# Patient Record
Sex: Male | Born: 1980 | Race: Black or African American | Hispanic: No | Marital: Single | Smoking: Current every day smoker
Health system: Southern US, Community
[De-identification: ages and names within clinical notes are randomized; demographics above are authoritative.]

## PROBLEM LIST (undated history)

## (undated) HISTORY — PX: WISDOM TOOTH EXTRACTION: SHX21

---

## 2020-06-03 ENCOUNTER — Encounter (HOSPITAL_BASED_OUTPATIENT_CLINIC_OR_DEPARTMENT_OTHER): Payer: Self-pay | Admitting: Emergency Medicine

## 2020-06-03 ENCOUNTER — Emergency Department (HOSPITAL_BASED_OUTPATIENT_CLINIC_OR_DEPARTMENT_OTHER)
Admission: EM | Admit: 2020-06-03 | Discharge: 2020-06-03 | Disposition: A | Discharge status: Home / Self Care | Payer: Medicaid Other | Attending: Emergency Medicine | Admitting: Emergency Medicine

## 2020-06-03 ENCOUNTER — Other Ambulatory Visit: Payer: Self-pay

## 2020-06-03 DIAGNOSIS — K047 Periapical abscess without sinus: Secondary | ICD-10-CM | POA: Diagnosis not present

## 2020-06-03 DIAGNOSIS — R220 Localized swelling, mass and lump, head: Secondary | ICD-10-CM | POA: Diagnosis present

## 2020-06-03 MED ORDER — LIDOCAINE HCL (PF) 1 % IJ SOLN
30.0000 mL | Freq: Once | INTRAMUSCULAR | Status: DC
  Filled 2020-06-03: qty 30

## 2020-06-03 MED ORDER — HYDROCODONE-ACETAMINOPHEN 5-325 MG PO TABS
1.0000 | ORAL_TABLET | Freq: Four times a day (QID) | ORAL | 0 refills | Status: DC | PRN
Start: 2020-06-03 — End: 2023-10-04

## 2020-06-03 MED ORDER — HYDROCODONE-ACETAMINOPHEN 5-325 MG PO TABS
2.0000 | ORAL_TABLET | Freq: Once | ORAL | Status: AC
  Administered 2020-06-03: 2 via ORAL
  Filled 2020-06-03: qty 2

## 2020-06-03 MED ORDER — CLINDAMYCIN HCL 300 MG PO CAPS: 300.0000 mg | ORAL_CAPSULE | Freq: Three times a day (TID) | ORAL | 0 refills | Status: DC

## 2020-06-03 MED ORDER — CLINDAMYCIN HCL 150 MG PO CAPS
300.0000 mg | ORAL_CAPSULE | Freq: Once | ORAL | Status: AC
  Administered 2020-06-03: 300 mg via ORAL
  Filled 2020-06-03: qty 2

## 2020-06-03 NOTE — ED Triage Notes (Signed)
Patient presents with complaints of facial swelling onset 1 week ago; states pain when opening mouth; denies any recent trauma; denies recent dental work; denies dental pain.

## 2020-06-03 NOTE — ED Provider Notes (Signed)
[facility] EMERGENCY DEPARTMENT Provider Note   CSN: [id number] Arrival date & time: 06/03/20  0149     History Chief Complaint  Patient presents with  . Facial Swelling    Darrell Price is a 39 y.o. male.  The history is provided by the patient.  Dental Pain Location:  Upper Quality:  Aching Severity:  Moderate Onset quality:  Sudden Timing:  Constant Progression:  Unchanged Chronicity:  New Relieved by:  Nothing Worsened by:  Jaw movement Associated symptoms: facial swelling   Associated symptoms: no fever         PMH-none Social History   Tobacco Use  . Smoking status: Never Smoker  . Smokeless tobacco: Never Used  Substance Use Topics  . Alcohol use: Not Currently  . Drug use: Not Currently    Home Medications Prior to Admission medications   Medication Sig Start Date End Date Taking? Authorizing Provider  clindamycin (CLEOCIN) 300 MG capsule Take 1 capsule (300 mg total) by mouth 3 (three) times daily. X 7 days 06/03/20   Zadie Rhine, MD  HYDROcodone-acetaminophen (NORCO/VICODIN) 5-325 MG tablet Take 1 tablet by mouth every 6 (six) hours as needed for severe pain. 06/03/20   Zadie Rhine, MD    Allergies    Amoxicillin and Peanut-containing drug products  Review of Systems   Review of Systems  Constitutional: Negative for fever.  HENT: Positive for dental problem and facial swelling.   Eyes: Negative for visual disturbance.  Gastrointestinal: Negative for vomiting.    Physical Exam Updated Vital Signs BP 124/78 (BP Location: Right Arm)   Pulse 78   Temp 98.1 F (36.7 C) (Oral)   Resp 18   Ht 1.676 m (5\' 6" )   Wt 81.6 kg   SpO2 97%   BMI 29.05 kg/m   Physical Exam CONSTITUTIONAL: Well developed/well nourished HEAD: Normocephalic/atraumatic EYES: EOMI/PERRL ENMT: Mucous membranes moist, poor dentition.  Decayed tooth noted to left molar region.  Gingival abscess noted to the area.  Mild facial swelling is noted,  no external abscess.  No facial crepitus no trismus.  No drooling or stridor NECK: supple no meningeal signs CV: S1/S2 noted, no murmurs/rubs/gallops noted LUNGS: Lungs are clear to auscultation bilaterally, no apparent distress ABDOMEN: soft  NEURO: Pt is awake/alert/appropriate, moves all extremitiesx4.  No facial droop.   EXTREMITIES:  full ROM SKIN: warm, color normal PSYCH: no abnormalities of mood noted, alert and oriented to situation  ED Results / Procedures / Treatments   Labs (all labs ordered are listed, but only abnormal results are displayed) Labs Reviewed - No data to display  EKG None  Radiology No results found.  Procedures . Incision and Drainage  Date/Time: 06/03/2020 3:30 AM Performed by: 06/05/2020, MD Authorized by: Zadie Rhine, MD   Consent:    Consent obtained:  Verbal   Consent given by:  Patient   Risks discussed:  Bleeding and incomplete drainage   Alternatives discussed:  No treatment Location:    Indications for incision and drainage: Dental abscess.   Location:  Mouth   Mouth location: Gingival. Anesthesia (see MAR for exact dosages):    Anesthesia method:  Local infiltration and topical application   Topical anesthetic:  Benzocaine gel Procedure type:    Complexity:  Simple Procedure details:    Needle aspiration: no     Incision types:  Stab incision   Scalpel blade:  11   Drainage:  Bloody and purulent   Drainage amount:  Moderate   Wound  treatment:  Wound left open    Medications Ordered in ED Medications  lidocaine (PF) (XYLOCAINE) 1 % injection 30 mL ( Infiltration Canceled Entry 06/03/20 0317)  clindamycin (CLEOCIN) capsule 300 mg (300 mg Oral Given 06/03/20 0234)  HYDROcodone-acetaminophen (NORCO/VICODIN) 5-325 MG per tablet 2 tablet (2 tablets Oral Given 06/03/20 0235)    ED Course  I have reviewed the triage vital signs and the nursing notes.  MDM Rules/Calculators/A&P                          Patient  with dental abscess.  After placing benzocaine, I made a stab incision to the gingival abscess, patient had a moderate amount of blood and pus extracted.  Overall he feels improved.  Bleeding has been controlled.  Patient will be discharged home Final Clinical Impression(s) / ED Diagnoses Final diagnoses:  Dental abscess    Rx / DC Orders ED Discharge Orders         Ordered    clindamycin (CLEOCIN) 300 MG capsule  3 times daily        06/03/20 0221    HYDROcodone-acetaminophen (NORCO/VICODIN) 5-325 MG tablet  Every 6 hours PRN        06/03/20 0249           Zadie Rhine, MD 06/03/20 0345

## 2021-11-18 ENCOUNTER — Emergency Department (HOSPITAL_BASED_OUTPATIENT_CLINIC_OR_DEPARTMENT_OTHER)
Admission: EM | Admit: 2021-11-18 | Discharge: 2021-11-18 | Disposition: A | Discharge status: Home / Self Care | Payer: Medicaid Other | Attending: Emergency Medicine | Admitting: Emergency Medicine

## 2021-11-18 ENCOUNTER — Emergency Department (HOSPITAL_BASED_OUTPATIENT_CLINIC_OR_DEPARTMENT_OTHER): Payer: Medicaid Other

## 2021-11-18 ENCOUNTER — Encounter (HOSPITAL_BASED_OUTPATIENT_CLINIC_OR_DEPARTMENT_OTHER): Payer: Self-pay | Admitting: Emergency Medicine

## 2021-11-18 ENCOUNTER — Other Ambulatory Visit: Payer: Self-pay

## 2021-11-18 DIAGNOSIS — S6991XA Unspecified injury of right wrist, hand and finger(s), initial encounter: Secondary | ICD-10-CM | POA: Diagnosis present

## 2021-11-18 DIAGNOSIS — S62339A Displaced fracture of neck of unspecified metacarpal bone, initial encounter for closed fracture: Secondary | ICD-10-CM

## 2021-11-18 DIAGNOSIS — S62316A Displaced fracture of base of fifth metacarpal bone, right hand, initial encounter for closed fracture: Secondary | ICD-10-CM | POA: Diagnosis not present

## 2021-11-18 DIAGNOSIS — M79641 Pain in right hand: Secondary | ICD-10-CM | POA: Diagnosis not present
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
## 2021-11-18 NOTE — Discharge Instructions (Addendum)
You were diagnosed today with a fracture of the right hand at the fifth metacarpal.  This was placed in a splint.  You should keep the splint on at all times.  Recommend Tylenol and Advil for pain and inflammation control.  You may also use ice as tolerated.  Please call the provided hand surgery office first thing Monday morning to schedule an appointment for follow-up. ?

## 2022-06-13 ENCOUNTER — Other Ambulatory Visit: Payer: Self-pay | Admitting: Family Medicine

## 2022-06-13 DIAGNOSIS — S161XXA Strain of muscle, fascia and tendon at neck level, initial encounter: Secondary | ICD-10-CM

## 2022-06-20 ENCOUNTER — Ambulatory Visit
Admission: RE | Admit: 2022-06-20 | Discharge: 2022-06-20 | Disposition: A | Discharge status: Home / Self Care | Payer: Worker's Compensation | Source: Ambulatory Visit | Attending: Family Medicine | Admitting: Family Medicine

## 2022-06-20 DIAGNOSIS — S161XXA Strain of muscle, fascia and tendon at neck level, initial encounter: Secondary | ICD-10-CM

## 2022-10-26 LAB — AMB RESULTS CONSOLE CBG: Glucose: 90

## 2022-11-10 ENCOUNTER — Encounter: Payer: Self-pay | Admitting: *Deleted

## 2022-11-10 NOTE — Progress Notes (Signed)
Pt attended 10/26/22 event where b/p was 153/91. At the event, the pt stated he did not have a PCP and did not identify any SDOH insecurities. During follow up phone call, pt confirmed he does sometimes use the health dept for healthcare but had not had PCP. Mailing address updated from event and pt asked that PCP options be mailed to him so [facility] flyer and [facility] info mailed to him. Pt states he also has MY Chart so he confirmed he would check letter on My Chart also, as he has misplaced his event paper with his exact b/p for f/u with PCP in future.

## 2023-01-04 ENCOUNTER — Encounter: Payer: Self-pay | Admitting: *Deleted

## 2023-01-04 NOTE — Progress Notes (Signed)
Pt attended 10/26/22 screening event where his b/p was 153/91 (on recheck) and his blood sugar was 90. At the event, the pt did not identify any SDOH insecurities and documented he did not have a PCP. During 1st event follow-up, pt asked for PCP info to be mailed to him at a temporary address in [place], so letter sent with [facility] and [facility] flyers. Today, during the 2nd follow up call, pt states he did receiver the flyers and has chosen the PCP he wants to see from the info mailed to him but has not yet made an appt to get established. He noted his current address on file in [place] is still correct and pt verbalized understanding of the benefits of getting established with PCP for ongoing healthcare access and to address any health issues. Pt did state he has had his b/p rechecked and "it was better - must just have been something I did that day." Pt did not have any additional healthcare access support needs at this time.

## 2023-05-06 ENCOUNTER — Other Ambulatory Visit: Payer: Self-pay

## 2023-05-06 ENCOUNTER — Encounter (HOSPITAL_BASED_OUTPATIENT_CLINIC_OR_DEPARTMENT_OTHER): Payer: Self-pay

## 2023-05-06 ENCOUNTER — Emergency Department (HOSPITAL_BASED_OUTPATIENT_CLINIC_OR_DEPARTMENT_OTHER)
Admission: EM | Admit: 2023-05-06 | Discharge: 2023-05-06 | Disposition: A | Discharge status: Home / Self Care | Payer: 59 | Attending: Emergency Medicine | Admitting: Emergency Medicine

## 2023-05-06 DIAGNOSIS — Z0000 Encounter for general adult medical examination without abnormal findings: Secondary | ICD-10-CM

## 2023-05-06 DIAGNOSIS — Z91010 Allergy to peanuts: Secondary | ICD-10-CM | POA: Diagnosis not present

## 2023-05-06 DIAGNOSIS — R7309 Other abnormal glucose: Secondary | ICD-10-CM | POA: Diagnosis not present

## 2023-05-06 DIAGNOSIS — Z113 Encounter for screening for infections with a predominantly sexual mode of transmission: Secondary | ICD-10-CM | POA: Insufficient documentation

## 2023-05-06 DIAGNOSIS — Z0001 Encounter for general adult medical examination with abnormal findings: Secondary | ICD-10-CM | POA: Insufficient documentation

## 2023-05-06 LAB — HIV ANTIBODY (ROUTINE TESTING W REFLEX): HIV Screen 4th Generation wRfx: NONREACTIVE

## 2023-05-06 LAB — CBG MONITORING, ED: Glucose-Capillary: 101 mg/dL — ABNORMAL HIGH (ref 70–99)

## 2023-05-06 NOTE — ED Triage Notes (Signed)
Pt reports he would like to get screened for STD's. Pt would like a wellness check and check his BP and blood glucose, it runs in his family and he thinks he might be diabetic. No urinary sx, no pain.

## 2023-05-06 NOTE — ED Provider Notes (Signed)
[place] [facility]ERGENCY DEPARTMENT AT [facility] Provider Note   CSN: [id number] Arrival date & time: 05/06/23  0109     History  Chief Complaint  Patient presents with   STD Check   Wellness check    Darrell Price is a 42 y.o. male.  The history is provided by the patient.  Darrell Price is a 42 y.o. male who presents to the Emergency Department complaining of wants a checkup.  He presents to the emergency department for request of routine examination because he has not had a checkup by his doctor.  He has a family history of diabetes wants to make sure his sugar is okay because he craves sugar sometimes but feels bad when he eats it.  He also request screening test for STD.  He does not have any urinary symptoms.  He does have a new sexual partner.  He has no known medical problems.  He has a family history of diabetes and hypertension.     Home Medications Prior to Admission medications   Medication Sig Start Date End Date Taking? Authorizing Provider  clindamycin (CLEOCIN) 300 MG capsule Take 1 capsule (300 mg total) by mouth 3 (three) times daily. X 7 days 06/03/20   Zadie Rhine, MD  HYDROcodone-acetaminophen (NORCO/VICODIN) 5-325 MG tablet Take 1 tablet by mouth every 6 (six) hours as needed for severe pain. 06/03/20   Zadie Rhine, MD      Allergies    Shellfish allergy, Amoxicillin, and Peanut-containing drug products    Review of Systems   Review of Systems  All other systems reviewed and are negative.   Physical Exam Updated Vital Signs BP 130/83 (BP Location: Right Arm)   Pulse 89   Temp 98 F (36.7 C) (Oral)   Resp 18   Ht 5\' 6"  (1.676 m)   Wt 83.9 kg   SpO2 97%   BMI 29.85 kg/m  Physical Exam Vitals and nursing note reviewed.  Constitutional:      Appearance: He is well-developed.  HENT:     Head: Normocephalic and atraumatic.  Cardiovascular:     Rate and Rhythm: Normal rate and regular rhythm.  Pulmonary:     Effort:  Pulmonary effort is normal. No respiratory distress.  Abdominal:     Palpations: Abdomen is soft.     Tenderness: There is no abdominal tenderness. There is no guarding or rebound.  Musculoskeletal:        General: No tenderness.  Skin:    General: Skin is warm and dry.  Neurological:     Mental Status: He is alert and oriented to person, place, and time.  Psychiatric:        Behavior: Behavior normal.     ED Results / Procedures / Treatments   Labs (all labs ordered are listed, but only abnormal results are displayed) Labs Reviewed  CBG MONITORING, ED - Abnormal; Notable for the following components:      Result Value   Glucose-Capillary 101 (*)    All other components within normal limits  HIV ANTIBODY (ROUTINE TESTING W REFLEX)  RPR  GC/CHLAMYDIA PROBE AMP () NOT AT [facility]    EKG None  Radiology No results found.  Procedures Procedures    Medications Ordered in ED Medications - No data to display  ED Course/ Medical Decision Making/ A&P  Medical Decision Making Amount and/or Complexity of Data Reviewed Labs: ordered.   Patient requesting a well check.  Has a family history of diabetes.  He is also requesting STD screening.  He has no systemic complaints.  His nonfasting blood sugar is 101.  Will send STD screening.  Discussed outpatient follow-up to establish a family doctor.  Discussed safe sexual practices.        Final Clinical Impression(s) / ED Diagnoses Final diagnoses:  Well adult health check    Rx / DC Orders ED Discharge Orders     None         Tilden Fossa, MD 05/06/23 0320

## 2023-05-07 ENCOUNTER — Encounter: Payer: Self-pay | Admitting: *Deleted

## 2023-05-07 LAB — GC/CHLAMYDIA PROBE AMP (~~LOC~~) NOT AT ARMC
Chlamydia: NEGATIVE
Comment: NEGATIVE
Comment: NORMAL
Neisseria Gonorrhea: NEGATIVE

## 2023-05-07 LAB — RPR: RPR Ser Ql: NONREACTIVE

## 2023-05-07 NOTE — Progress Notes (Signed)
Pt attended 10/26/22 screening event where his b/p was 155/74 and his blood sugar was 90. At the event, the pt documented not having a PCP and did not identify any SDOH insecurities. During the initial and 60 day event f/u with pt, his address changed multiple times and he shared during the 6 month event f/u today that he had insurance but was told by his insurance that there were no PCP in the [place] area that took his insurance. Pt advised that multiple PCP at [facility] facilities were taking new pts and perhaps he could call the ones closest to his home in [facility] or any of the [place] locations he thought he could use to verify that his insurance was accepted and make an appt. Chart review indicates pt presented to the [facility] ED for an adult wellness exam just 1 day ago, 05/06/23 where his b/p was 130/83. Pt shared most recent address change and [facility] PCP options as well as [place] [facility] options and [facility] flyers were all mailed to him today. No additional health equity team support scheduled at this time.

## 2023-09-11 ENCOUNTER — Emergency Department (HOSPITAL_BASED_OUTPATIENT_CLINIC_OR_DEPARTMENT_OTHER)
Admission: EM | Admit: 2023-09-11 | Discharge: 2023-09-11 | Disposition: A | Discharge status: Home / Self Care | Attending: Emergency Medicine | Admitting: Emergency Medicine

## 2023-09-11 ENCOUNTER — Encounter (HOSPITAL_BASED_OUTPATIENT_CLINIC_OR_DEPARTMENT_OTHER): Payer: Self-pay | Admitting: Emergency Medicine

## 2023-09-11 ENCOUNTER — Emergency Department (HOSPITAL_BASED_OUTPATIENT_CLINIC_OR_DEPARTMENT_OTHER)

## 2023-09-11 ENCOUNTER — Other Ambulatory Visit: Payer: Self-pay

## 2023-09-11 DIAGNOSIS — M545 Low back pain, unspecified: Secondary | ICD-10-CM | POA: Diagnosis present

## 2023-09-11 DIAGNOSIS — M25569 Pain in unspecified knee: Secondary | ICD-10-CM | POA: Insufficient documentation

## 2023-09-11 DIAGNOSIS — M62838 Other muscle spasm: Secondary | ICD-10-CM | POA: Insufficient documentation

## 2023-09-11 DIAGNOSIS — R1031 Right lower quadrant pain: Secondary | ICD-10-CM | POA: Diagnosis not present

## 2023-09-11 DIAGNOSIS — Z91010 Allergy to peanuts: Secondary | ICD-10-CM | POA: Insufficient documentation

## 2023-09-11 LAB — URINALYSIS, ROUTINE W REFLEX MICROSCOPIC
Bilirubin Urine: NEGATIVE
Glucose, UA: NEGATIVE mg/dL
Hgb urine dipstick: NEGATIVE
Ketones, ur: NEGATIVE mg/dL
Leukocytes,Ua: NEGATIVE
Nitrite: NEGATIVE
Protein, ur: NEGATIVE mg/dL
Specific Gravity, Urine: 1.02 (ref 1.005–1.030)
pH: 7 (ref 5.0–8.0)

## 2023-09-11 LAB — COMPREHENSIVE METABOLIC PANEL
ALT: 14 U/L (ref 0–44)
AST: 15 U/L (ref 15–41)
Albumin: 3.6 g/dL (ref 3.5–5.0)
Alkaline Phosphatase: 41 U/L (ref 38–126)
Anion gap: 8 (ref 5–15)
BUN: 9 mg/dL (ref 6–20)
CO2: 23 mmol/L (ref 22–32)
Calcium: 8.6 mg/dL — ABNORMAL LOW (ref 8.9–10.3)
Chloride: 106 mmol/L (ref 98–111)
Creatinine, Ser: 0.83 mg/dL (ref 0.61–1.24)
GFR, Estimated: >60 mL/min (ref >60)
Glucose, Bld: 125 mg/dL — ABNORMAL HIGH (ref 70–99)
Potassium: 3.3 mmol/L — ABNORMAL LOW (ref 3.5–5.1)
Sodium: 137 mmol/L (ref 135–145)
Total Bilirubin: 0.5 mg/dL (ref 0.0–1.2)
Total Protein: 6.8 g/dL (ref 6.5–8.1)

## 2023-09-11 LAB — CBC
HCT: 42.7 % (ref 39.0–52.0)
Hemoglobin: 14.4 g/dL (ref 13.0–17.0)
MCH: 30.1 pg (ref 26.0–34.0)
MCHC: 33.7 g/dL (ref 30.0–36.0)
MCV: 89.1 fL (ref 80.0–100.0)
Platelets: 276 10*3/uL (ref 150–400)
RBC: 4.79 MIL/uL (ref 4.22–5.81)
RDW: 13.8 % (ref 11.5–15.5)
WBC: 6.3 10*3/uL (ref 4.0–10.5)
nRBC: 0 % (ref 0.0–0.2)

## 2023-09-11 LAB — D-DIMER, QUANTITATIVE: D-Dimer, Quant: <0.27 ug{FEU}/mL (ref 0.00–0.50)

## 2023-09-11 LAB — LIPASE, BLOOD: Lipase: 21 U/L (ref 11–51)

## 2023-09-11 MED ORDER — IOHEXOL 300 MG/ML  SOLN
100.0000 mL | Freq: Once | INTRAMUSCULAR | Status: AC | PRN
Start: 2023-09-11 — End: 2023-09-11
  Administered 2023-09-11: 100 mL via INTRAVENOUS

## 2023-09-11 NOTE — Discharge Instructions (Signed)
 Take Motrin 600 mg every 8 hours as needed for pain and/or Tylenol 650 mg every 8 hours as needed for pain.  Phone number to general surgery office provided for signs and symptoms of right sided inguinal hernia.
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
## 2023-09-11 NOTE — ED Notes (Signed)
 Patient transported to CT

## 2023-09-11 NOTE — ED Provider Notes (Signed)
 [place] [facility] Provider Note   CSN: [id number] Arrival date & time: 09/11/23  1308     History  Chief Complaint  Patient presents with   Back Pain   Abdominal Pain   Knee Pain    Darrell Price is a 43 y.o. male.  Patient is a 43 year old male presenting for low back pain.  Patient admits to low back pain near the middle of the back and to the left with radiation to the left lower abdomen and left testicle.  He denies any history of kidney stones, hematuria, increased frequency, or urinary urgency.  He denies any rectal pain or rectal pressure.  Denies melena or hematochezia.  He denies nausea or vomiting.  He states pain is worse with standing or sitting and improves with lying flat.  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  The history is provided by the patient. No language interpreter was used.  Back Pain Associated symptoms: abdominal pain   Associated symptoms: no chest pain, no dysuria and no fever   Abdominal Pain Associated symptoms: no chest pain, no chills, no cough, no dysuria, no fever, no hematuria, no shortness of breath, no sore throat and no vomiting   Knee Pain Associated symptoms: back pain   Associated symptoms: no fever        Home Medications Prior to Admission medications   Medication Sig Start Date End Date Taking? Authorizing Provider  clindamycin (CLEOCIN) 300 MG capsule Take 1 capsule (300 mg total) by mouth 3 (three) times daily. X 7 days 06/03/20   Zadie Rhine, MD  HYDROcodone-acetaminophen (NORCO/VICODIN) 5-325 MG tablet Take 1 tablet by mouth every 6 (six) hours as needed for severe pain. 06/03/20   Zadie Rhine, MD      Allergies    Shellfish allergy, Amoxicillin, and Peanut-containing drug products    Review of Systems   Review of Systems  Constitutional:  Negative for chills and fever.  HENT:  Negative for ear pain and  sore throat.   Eyes:  Negative for pain and visual disturbance.  Respiratory:  Negative for cough and shortness of breath.   Cardiovascular:  Negative for chest pain and palpitations.  Gastrointestinal:  Positive for abdominal pain. Negative for vomiting.  Genitourinary:  Positive for testicular pain. Negative for dysuria and hematuria.  Musculoskeletal:  Positive for back pain. Negative for arthralgias.  Skin:  Negative for color change and rash.  Neurological:  Negative for seizures and syncope.  All other systems reviewed and are negative.   Physical Exam Updated Vital Signs BP (!) 137/91   Pulse 77   Temp 97.7 F (36.5 C)   Resp 18   Ht 5\' 6"  (1.676 m)   Wt 80.2 kg   SpO2 98%   BMI 28.55 kg/m  Physical Exam Vitals and nursing note reviewed. Exam conducted with a chaperone present.  Constitutional:      General: He is not in acute distress.    Appearance: He is well-developed.  HENT:     Head: Normocephalic and atraumatic.  Eyes:     Conjunctiva/sclera: Conjunctivae normal.  Cardiovascular:     Rate and Rhythm: Normal rate and regular rhythm.     Heart sounds: No murmur heard. Pulmonary:     Effort: Pulmonary effort is normal. No respiratory distress.     Breath sounds: Normal breath sounds.  Abdominal:     Palpations:  Abdomen is soft.     Tenderness: There is abdominal tenderness in the right lower quadrant. There is no guarding or rebound.  Genitourinary:    Penis: Normal and uncircumcised.      Testes:        Right: Tenderness present. Mass, testicular hydrocele or varicocele not present. Right testis is descended.  Musculoskeletal:        General: No swelling.     Cervical back: Neck supple.     Lumbar back: Tenderness and bony tenderness present. No swelling, edema, deformity or signs of trauma.  Skin:    General: Skin is warm and dry.     Capillary Refill: Capillary refill takes less than 2 seconds.  Neurological:     Mental Status: He is alert.   Psychiatric:        Mood and Affect: Mood normal.     ED Results / Procedures / Treatments   Labs (all labs ordered are listed, but only abnormal results are displayed) Labs Reviewed  COMPREHENSIVE METABOLIC PANEL - Abnormal; Notable for the following components:      Result Value   Potassium 3.3 (*)    Glucose, Bld 125 (*)    Calcium 8.6 (*)    All other components within normal limits  LIPASE, BLOOD  CBC  URINALYSIS, ROUTINE W REFLEX MICROSCOPIC  D-DIMER, QUANTITATIVE    EKG None  Radiology CT ABDOMEN PELVIS W CONTRAST Result Date: 09/11/2023 CLINICAL DATA:  Right lower quadrant pain EXAM: CT ABDOMEN AND PELVIS WITH CONTRAST TECHNIQUE: Multidetector CT imaging of the abdomen and pelvis was performed using the standard protocol following bolus administration of intravenous contrast. RADIATION DOSE REDUCTION: This exam was performed according to the departmental dose-optimization program which includes automated exposure control, adjustment of the mA and/or kV according to patient size and/or use of iterative reconstruction technique. CONTRAST:  OMNIPAQUE IOHEXOL 300 MG/ML  SOLN COMPARISON:  Scrotal ultrasound 09/11/2023, for CT 11/10/2009 FINDINGS: Lower chest: No acute abnormality. Hepatobiliary: No focal liver abnormality is seen. No gallstones, gallbladder wall thickening, or biliary dilatation. Pancreas: Unremarkable. No pancreatic ductal dilatation or surrounding inflammatory changes. Spleen: Normal in size without focal abnormality. Adrenals/Urinary Tract: Adrenal glands are unremarkable. Kidneys are normal, without renal calculi, focal lesion, or hydronephrosis. Bladder is unremarkable. Stomach/Bowel: Stomach is within normal limits. Appendix appears normal. No evidence of bowel wall thickening, distention, or inflammatory changes. Vascular/Lymphatic: No significant vascular findings are present. No enlarged abdominal or pelvic lymph nodes. Reproductive: Prostate is  unremarkable. Other: No abdominal wall hernia or abnormality. No abdominopelvic ascites. Small fat containing periumbilical hernia Musculoskeletal: No acute or significant osseous findings. IMPRESSION: Negative CT of the abdomen and pelvis. Electronically Signed   By: Jasmine Pang M.D.   On: 09/11/2023 20:44   US SCROTUM W/DOPPLER Result Date: 09/11/2023 CLINICAL DATA:  Left testicle pain right groin pain EXAM: SCROTAL ULTRASOUND DOPPLER ULTRASOUND OF THE TESTICLES TECHNIQUE: Complete ultrasound examination of the testicles, epididymis, and other scrotal structures was performed. Color and spectral Doppler ultrasound were also utilized to evaluate blood flow to the testicles. COMPARISON:  None Available. FINDINGS: Right testicle Measurements: 5 x 2.3 x 2.9 cm. No mass or microlithiasis visualized. Left testicle Measurements: 4.9 x 2.5 x 2.6 cm. No mass or microlithiasis visualized. Probable small appended Right epididymis:  Normal in size and appearance. Left epididymis:  Normal in size and appearance. Hydrocele: Small left greater than right hydroceles with particulate debris. Varicocele:  None visualized. Pulsed Doppler interrogation of both testes demonstrates  normal low resistance arterial and venous waveforms bilaterally. Limited ultrasound of right groin with and without Valsalva demonstrates no groin hernia IMPRESSION: 1. Negative for testicular torsion 2. Small bilateral hydroceles 3. Negative for bowel containing right groin hernia Electronically Signed   By: Jasmine Pang M.D.   On: 09/11/2023 19:29    Procedures Procedures    Medications Ordered in ED Medications  iohexol (OMNIPAQUE) 300 MG/ML solution 100 mL (100 mLs Intravenous Contrast Given 09/11/23 2027)    ED Course/ Medical Decision Making/ A&P                                 Medical Decision Making Amount and/or Complexity of Data Reviewed Labs: ordered. Radiology: ordered.  Risk Prescription drug management.   79:55 PM   43 year old male presenting for low back pain.  Patient admits to low back pain near the middle of the back and to the left with radiation to the left lower abdomen and left testicle.  Patient is alert and oriented x 3, no acute distress, afebrile, stable vital signs.  No midline spinal tenderness.  Patient does have right sided lumbar paraspinal muscle spasm and tenderness.  Patient also has right lower quadrant abdominal tenderness without guarding or rigidity.  Also has tenderness in the right inguinal region.  Note inguinal hernia palpated.  No testicular tenderness or hydrocele.  Ultrasound of the testicles demonstrate no acute process.  No loops of bowel.  UA demonstrates no urinary tract infection.  No hematuria to suggest urethral lithiasis.  CT abdomen pelvis demonstrates no appendicitis for the right lower quadrant abdominal pain.  No leukocytosis or signs or symptoms of sepsis.  Although imaging was unable to identify an inguinal hernia and I was unable to palpate 1 on physical exam patient symptoms do sound like an inguinal hernia with his symptoms being provoked by standing for long periods of time.  I recommend that he notes whether or not he has unilateral scrotal swelling when he has pain and follow-up with general surgery if he develops it.  Differential diagnosis also included psoas syndrome.  Recommended for Motrin, Tylenol, and rest and follow-up with PCP.   Patient in no distress and overall condition improved here in the ED. Detailed discussions were had with the patient regarding current findings, and need for close f/u with PCP or on call doctor. The patient has been instructed to return immediately if the symptoms worsen in any way for re-evaluation. Patient verbalized understanding and is in agreement with current care plan. All questions answered prior to discharge.         Final Clinical Impression(s) / ED Diagnoses Final diagnoses:  Acute right-sided low back pain without  sciatica  Muscle spasm  RLQ abdominal pain    Rx / DC Orders ED Discharge Orders     None         Franne Forts, DO 09/11/23 2056

## 2023-09-18 ENCOUNTER — Encounter (HOSPITAL_BASED_OUTPATIENT_CLINIC_OR_DEPARTMENT_OTHER): Payer: Self-pay | Admitting: Urology

## 2023-09-18 ENCOUNTER — Emergency Department (HOSPITAL_BASED_OUTPATIENT_CLINIC_OR_DEPARTMENT_OTHER)
Admission: EM | Admit: 2023-09-18 | Discharge: 2023-09-18 | Disposition: A | Discharge status: Home / Self Care | Attending: Emergency Medicine | Admitting: Emergency Medicine

## 2023-09-18 DIAGNOSIS — M5441 Lumbago with sciatica, right side: Secondary | ICD-10-CM | POA: Insufficient documentation

## 2023-09-18 DIAGNOSIS — Z91010 Allergy to peanuts: Secondary | ICD-10-CM | POA: Diagnosis not present

## 2023-09-18 DIAGNOSIS — M545 Low back pain, unspecified: Secondary | ICD-10-CM | POA: Diagnosis present

## 2023-09-18 MED ORDER — KETOROLAC TROMETHAMINE 15 MG/ML IJ SOLN
15.0000 mg | Freq: Once | INTRAMUSCULAR | Status: AC
  Administered 2023-09-18: 15 mg via INTRAMUSCULAR
  Filled 2023-09-18: qty 1

## 2023-09-18 MED ORDER — METHYLPREDNISOLONE 4 MG PO TBPK: ORAL_TABLET | ORAL | 0 refills | Status: DC

## 2023-09-18 MED ORDER — DICLOFENAC SODIUM 1 % EX GEL: 4.0000 g | Freq: Four times a day (QID) | CUTANEOUS | 0 refills | Status: DC

## 2023-09-18 MED ORDER — ACETAMINOPHEN 500 MG PO TABS
1000.0000 mg | ORAL_TABLET | Freq: Once | ORAL | Status: DC
  Filled 2023-09-18: qty 2

## 2023-09-18 NOTE — ED Triage Notes (Signed)
 Pt ambulatory to triage with slow limping gait  Pt states continued pain in right side of back radiating down right leg into knee since last seen on 3/4 for similar pain  States is a sharp pain

## 2023-09-18 NOTE — ED Provider Notes (Signed)
 [place] [facility] Provider Note   CSN: [id number] Arrival date & time: 09/18/23  1011     History  Chief Complaint  Patient presents with   Back Pain    Darrell Price is a 43 y.o. male.  43 yo M with a chief complaints of right-sided low back pain that radiates down the leg.  This been going on now for about a week.  He been seen in the emergency department and had imaging that was unremarkable.  He has been trying over-the-counter medications without significant improvement.  Has remote trauma but denies recent trauma to the area.  Denies loss of bowel or bladder denies loss of rectal sensation denies numbness or weakness of the leg.  He denies IV drug use.   Back Pain      Home Medications Prior to Admission medications   Medication Sig Start Date End Date Taking? Authorizing Provider  diclofenac Sodium (VOLTAREN) 1 % GEL Apply 4 g topically 4 (four) times daily. 09/18/23  Yes Melene Plan, DO  methylPREDNISolone (MEDROL DOSEPAK) 4 MG TBPK tablet Day 1: 8mg  before breakfast, 4 mg after lunch, 4 mg after supper, and 8 mg at bedtime Day 2: 4 mg before breakfast, 4 mg after lunch, 4 mg  after supper, and 8 mg  at bedtime Day 3:  4 mg  before breakfast, 4 mg  after lunch, 4 mg after supper, and 4 mg  at bedtime Day 4: 4 mg  before breakfast, 4 mg  after lunch, and 4 mg at bedtime Day 5: 4 mg  before breakfast and 4 mg at bedtime Day 6: 4 mg  before breakfast 09/18/23  Yes Melene Plan, DO  clindamycin (CLEOCIN) 300 MG capsule Take 1 capsule (300 mg total) by mouth 3 (three) times daily. X 7 days 06/03/20   Zadie Rhine, MD  HYDROcodone-acetaminophen (NORCO/VICODIN) 5-325 MG tablet Take 1 tablet by mouth every 6 (six) hours as needed for severe pain. 06/03/20   Zadie Rhine, MD      Allergies    Shellfish allergy, Amoxicillin, and Peanut-containing drug products    Review of Systems   Review of Systems  Musculoskeletal:  Positive for back  pain.    Physical Exam Updated Vital Signs BP 134/87 (BP Location: Left Arm)   Pulse (!) 106   Temp 98.3 F (36.8 C)   Resp 18   Ht 5\' 6"  (1.676 m)   Wt 80.2 kg   SpO2 98%   BMI 28.54 kg/m  Physical Exam Vitals and nursing note reviewed.  Constitutional:      Appearance: He is well-developed.  HENT:     Head: Normocephalic and atraumatic.  Eyes:     Pupils: Pupils are equal, round, and reactive to light.  Neck:     Vascular: No JVD.  Cardiovascular:     Rate and Rhythm: Normal rate and regular rhythm.     Heart sounds: No murmur heard.    No friction rub. No gallop.  Pulmonary:     Effort: No respiratory distress.     Breath sounds: No wheezing.  Abdominal:     General: There is no distension.     Tenderness: There is no abdominal tenderness. There is no guarding or rebound.  Musculoskeletal:        General: Normal range of motion.     Cervical back: Normal range of motion and neck supple.     Comments: Pain about the right midline and  right low back.  Pulse motor and sensation intact to the right lower extremity.  Reflexes are 2+ and equal.  There is no clonus.  Skin:    Coloration: Skin is not pale.     Findings: No rash.  Neurological:     Mental Status: He is alert and oriented to person, place, and time.  Psychiatric:        Behavior: Behavior normal.     ED Results / Procedures / Treatments   Labs (all labs ordered are listed, but only abnormal results are displayed) Labs Reviewed - No data to display  EKG None  Radiology No results found.  Procedures Procedures    Medications Ordered in ED Medications  acetaminophen (TYLENOL) tablet 1,000 mg (has no administration in time range)  ketorolac (TORADOL) 15 MG/ML injection 15 mg (has no administration in time range)    ED Course/ Medical Decision Making/ A&P                                 Medical Decision Making Risk OTC drugs. Prescription drug management.   43 yo M with a chief  complaints of right-sided low back pain that radiates down the leg.  This been going on for the about a week or so.  Atraumatic.  Benign exam.  He has been trying NSAIDs without improvement.  Will switch to a Medrol Dosepak.  Diclofenac gel.  Encourage PCP follow-up.  12:45 PM:  I have discussed the diagnosis/risks/treatment options with the patient.  Evaluation and diagnostic testing in the emergency department does not suggest an emergent condition requiring admission or immediate intervention beyond what has been performed at this time.  They will follow up with PCP. We also discussed returning to the ED immediately if new or worsening sx occur. We discussed the sx which are most concerning (e.g., sudden worsening pain, fever, inability to tolerate by mouth, cauda equina s/x) that necessitate immediate return. Medications administered to the patient during their visit and any new prescriptions provided to the patient are listed below.  Medications given during this visit Medications  acetaminophen (TYLENOL) tablet 1,000 mg (has no administration in time range)  ketorolac (TORADOL) 15 MG/ML injection 15 mg (has no administration in time range)     The patient appears reasonably screen and/or stabilized for discharge and I doubt any other medical condition or other [facility] requiring further screening, evaluation, or treatment in the ED at this time prior to discharge.          Final Clinical Impression(s) / ED Diagnoses Final diagnoses:  Acute right-sided low back pain with right-sided sciatica    Rx / DC Orders ED Discharge Orders          Ordered    methylPREDNISolone (MEDROL DOSEPAK) 4 MG TBPK tablet        09/18/23 1242    diclofenac Sodium (VOLTAREN) 1 % GEL  4 times daily        09/18/23 1242              Melene Plan, DO 09/18/23 1245

## 2023-09-18 NOTE — ED Notes (Signed)
 ED Provider at bedside.

## 2023-09-18 NOTE — Discharge Instructions (Addendum)
 Your back pain is most likely due to a muscular strain.  There is been a lot of research on back pain, unfortunately the only thing that seems to really help is Tylenol and ibuprofen.  Relative rest is also important to not lift greater than 10 pounds bending or twisting at the waist.  Please follow-up with your family physician.  The other thing that really seems to benefit patients is physical therapy which your doctor may send you for.  Please return to the emergency department for new numbness or weakness to your arms or legs. Difficulty with urinating or urinating or pooping on yourself.  Also if you cannot feel toilet paper when you wipe or get a fever.   Stretching can also help try [email or web address]  Take 4 over the counter ibuprofen tablets 3 times a day or 2 over-the-counter naproxen tablets twice a day for pain. Also take tylenol 1000mg (2 extra strength) four times a day.

## 2023-10-05 ENCOUNTER — Ambulatory Visit: Admitting: Family Medicine

## 2023-10-05 ENCOUNTER — Ambulatory Visit (HOSPITAL_BASED_OUTPATIENT_CLINIC_OR_DEPARTMENT_OTHER)
Admission: RE | Admit: 2023-10-05 | Discharge: 2023-10-05 | Disposition: A | Discharge status: Home / Self Care | Source: Ambulatory Visit | Attending: Family Medicine | Admitting: Family Medicine

## 2023-10-05 ENCOUNTER — Encounter: Payer: Self-pay | Admitting: Family Medicine

## 2023-10-05 ENCOUNTER — Other Ambulatory Visit (HOSPITAL_COMMUNITY)
Admission: RE | Admit: 2023-10-05 | Discharge: 2023-10-05 | Disposition: A | Discharge status: Home / Self Care | Source: Ambulatory Visit | Attending: Family Medicine | Admitting: Family Medicine

## 2023-10-05 VITALS — BP 114/71 | HR 84 | Ht 66.0 in | Wt 170.0 lb

## 2023-10-05 DIAGNOSIS — F419 Anxiety disorder, unspecified: Secondary | ICD-10-CM | POA: Diagnosis not present

## 2023-10-05 DIAGNOSIS — E878 Other disorders of electrolyte and fluid balance, not elsewhere classified: Secondary | ICD-10-CM | POA: Diagnosis not present

## 2023-10-05 DIAGNOSIS — M5441 Lumbago with sciatica, right side: Secondary | ICD-10-CM | POA: Diagnosis present

## 2023-10-05 DIAGNOSIS — Z1159 Encounter for screening for other viral diseases: Secondary | ICD-10-CM | POA: Diagnosis not present

## 2023-10-05 DIAGNOSIS — Z113 Encounter for screening for infections with a predominantly sexual mode of transmission: Secondary | ICD-10-CM | POA: Insufficient documentation

## 2023-10-05 DIAGNOSIS — G8929 Other chronic pain: Secondary | ICD-10-CM | POA: Diagnosis present

## 2023-10-05 DIAGNOSIS — Z0000 Encounter for general adult medical examination without abnormal findings: Secondary | ICD-10-CM

## 2023-10-05 DIAGNOSIS — F32A Depression, unspecified: Secondary | ICD-10-CM

## 2023-10-05 LAB — BASIC METABOLIC PANEL WITH GFR
BUN: 9 mg/dL (ref 6–23)
CO2: 26 meq/L (ref 19–32)
Calcium: 9.2 mg/dL (ref 8.4–10.5)
Chloride: 106 meq/L (ref 96–112)
Creatinine, Ser: 0.87 mg/dL (ref 0.40–1.50)
GFR: 107.24 mL/min (ref >60.00)
Glucose, Bld: 102 mg/dL — ABNORMAL HIGH (ref 70–99)
Potassium: 4.2 meq/L (ref 3.5–5.1)
Sodium: 139 meq/L (ref 135–145)

## 2023-10-05 MED ORDER — NAPROXEN 500 MG PO TABS
500.0000 mg | ORAL_TABLET | Freq: Two times a day (BID) | ORAL | 1 refills | Status: DC
Start: 2023-10-05 — End: 2024-04-11

## 2023-10-05 MED ORDER — CYCLOBENZAPRINE HCL 5 MG PO TABS: 5.0000 mg | ORAL_TABLET | Freq: Three times a day (TID) | ORAL | 1 refills | Status: DC | PRN

## 2023-10-05 NOTE — Assessment & Plan Note (Signed)
 Chronic low back pain with right-sided sciatica, exacerbated by past injuries. Severe pain without medication, reduced with ibuprofen. No numbness or tingling, pain radiates to right leg. Previous treatments provided temporary relief.  - Order x-ray of lumbar spine. - Refer to orthopedic specialist for further evaluation - Prescribe naproxen for anti-inflammatory effect. Do not combine with other NSAIDs. - Prescribe cyclobenzaprine (Flexeril) as muscle relaxer, caution against alcohol, advise initial use at home - potential for drowsiness. - Provide home exercise handout for low back pain. - He declined PT referral at this time.
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
## 2023-10-05 NOTE — Patient Instructions (Signed)
 Back pain - try twice daily naproxen (do not combine with other antiinflammatories like ibuprofen); as needed muscle relaxer - this may cause some drowsiness so use cautiously. Xray today. Referral to ortho. Home exercise handout provided. If you change your mind about physical therapy, let me know.   -------------------  Thank you for choosing [facility] at [facility] for your Primary Care needs. I am excited for the opportunity to partner with you to meet your health care goals. It was a pleasure meeting you today!  Information on diet, exercise, and health maintenance recommendations are listed below. This is information to help you be sure you are on track for optimal health and monitoring.   Please look over this and let us know if you have any questions or if you have completed any of the health maintenance outside of [facility] so that we can be sure your records are up to date.  ___________________________________________________________  MyChart:  For all urgent or time sensitive needs we ask that you please call the office to avoid delays. Our number is [phone]. MyChart is not constantly monitored and due to the large volume of messages a day, replies may take up to 72 business hours.  MyChart Policy: MyChart allows for you to see your visit notes, after visit summary, provider recommendations, lab and tests results, make an appointment, request refills, and contact your provider or the office for non-urgent questions or concerns. Providers are seeing patients during normal business hours and do not have built in time to review MyChart messages.  We ask that you allow a minimum of 3 business days for responses to [employer]. For this reason, please do not send urgent requests through MyChart. Please call the office at [phone]. New and ongoing conditions may require a visit. We have virtual and in-person visits available for your convenience.   Complex MyChart concerns may require a visit. Your provider may request you schedule a virtual or in-person visit to ensure we are providing the best care possible. MyChart messages sent after 11:00 AM on Friday may not be received by the provider until Monday morning.    Lab and Test Results: You will receive your lab and test results on MyChart as soon as they are completed and results have been sent by the lab or testing facility. Due to this service, you will receive your results BEFORE your provider.  I review lab and test results each morning prior to seeing patients. Some results require collaboration with other providers to ensure you are receiving the most appropriate care. For this reason, we ask that you please allow a minimum of 3-5 business days from the time that ALL results have been received for your provider to receive and review lab and test results and contact you about these.  Most lab and test result comments from the provider will be sent through MyChart. Your provider may recommend changes to the plan of care, follow-up visits, repeat testing, ask questions, or request an office visit to discuss these results. You may reply directly to this message or call the office to provide information for the provider or set up an appointment. In some instances, you will be called with test results and recommendations. Please let us know if this is preferred and we will make note of this in your chart to provide this for you.    If you have not heard a response to your lab or test results in 5 business days  from all results returning to MyChart, please call the office to let us know. We ask that you please avoid calling prior to this time unless there is an emergent concern. Due to high call volumes, this can delay the resulting process.  After Hours: For all non-emergency after hours needs, please call the office at [phone] and select the option to reach the on-call  service. On-call  services are shared between multiple [facility] offices and therefore it will not be possible to speak directly with your provider. On-call providers may provide medical advice and recommendations, but are unable to provide refills for maintenance medications.  For all emergency or urgent medical needs after normal business hours, we recommend that you seek care at the closest Urgent Care or Emergency Department to ensure appropriate treatment in a timely manner.  [facility] has a 24 hour emergency room located on the ground floor for your convenience.   Urgent Concerns During the Business Day Providers are seeing patients from 8AM to 5PM with a busy schedule and are most often not able to respond to non-urgent calls until the end of the day or the next business day. If you should have URGENT concerns during the day, please call and speak to the nurse or schedule a same day appointment so that we can address your concern without delay.   Thank you, again, for choosing me as your health care partner. I appreciate your trust and look forward to learning more about you!   Lollie Marrow Reola Calkins, DNP, FNP-C  ___________________________________________________________  Health Maintenance Recommendations Screening Testing Mammogram Every 1-2 years based on history and risk factors Starting at age 1 Pap Smear Ages 21-39 every 3 years Ages 4-65 every 5 years with HPV testing More frequent testing may be required based on results and history Colon Cancer Screening Every 1-10 years based on test performed, risk factors, and history Starting at age 65 Bone Density Screening Every 2-10 years based on history Starting at age 19 for women Recommendations for men differ based on medication usage, history, and risk factors AAA Screening One time ultrasound Men 71-64 years old who have ever smoked Lung Cancer Screening Low Dose Lung CT every 12 months Age 28-80 years with a 20 pack-year  smoking history who still smoke or who have quit within the last 15 years  Screening Labs Routine  Labs: Complete Blood Count (CBC), Complete Metabolic Panel (CMP), Cholesterol (Lipid Panel) Every 6-12 months based on history and medications May be recommended more frequently based on current conditions or previous results Hemoglobin A1c Lab Every 3-12 months based on history and previous results Starting at age 87 or earlier with diagnosis of diabetes, high cholesterol, BMI >26, and/or risk factors Frequent monitoring for patients with diabetes to ensure blood sugar control Thyroid Panel  Every 6 months based on history, symptoms, and risk factors May be repeated more often if on medication HIV One time testing for all patients 38 and older May be repeated more frequently for patients with increased risk factors or exposure Hepatitis C One time testing for all patients 54 and older May be repeated more frequently for patients with increased risk factors or exposure Gonorrhea, Chlamydia Every 12 months for all sexually active persons 13-24 years Additional monitoring may be recommended for those who are considered high risk or who have symptoms PSA Men 42-45 years old with risk factors Additional screening may be recommended from age 40-69 based on risk factors, symptoms, and history  Vaccine  Recommendations Tetanus Booster All adults every 10 years Flu Vaccine All patients 6 months and older every year COVID Vaccine All patients 12 years and older Initial dosing with booster May recommend additional booster based on age and health history HPV Vaccine 2 doses all patients age 64-26 Dosing may be considered for patients over 26 Shingles Vaccine (Shingrix) 2 doses all adults 50 years and older Pneumonia (Pneumovax 23) All adults 65 years and older May recommend earlier dosing based on health history Pneumonia (Prevnar 26) All adults 65 years and older Dosed 1 year after  Pneumovax 23 Pneumonia (Prevnar 20) All adults 65 years and older (adults 19-64 with certain conditions or risk factors) 1 dose  For those who have not received Prevnar 13 vaccine previously   Additional Screening, Testing, and Vaccinations may be recommended on an individualized basis based on family history, health history, risk factors, and/or exposure.  __________________________________________________________  Diet Recommendations for All Patients  I recommend that all patients maintain a diet low in saturated fats, carbohydrates, and cholesterol. While this can be challenging at first, it is not impossible and small changes can make big differences.  Things to try: Decreasing the amount of soda, sweet tea, and/or juice to one or less per day and replace with water While water is always the first choice, if you do not like water you may consider adding a water additive without sugar to improve the taste other sugar free drinks Replace potatoes with a brightly colored vegetable  Use healthy oils, such as canola oil or olive oil, instead of butter or hard margarine Limit your bread intake to two pieces or less a day Replace regular pasta with low carb pasta options Bake, broil, or grill foods instead of frying Monitor portion sizes  Eat smaller, more frequent meals throughout the day instead of large meals  An important thing to remember is, if you love foods that are not great for your health, you don't have to give them up completely. Instead, allow these foods to be a reward when you have done well. Allowing yourself to still have special treats every once in a while is a nice way to tell yourself thank you for working hard to keep yourself healthy.   Also remember that every day is a new day. If you have a bad day and "fall off the wagon", you can still climb right back up and keep moving along on your journey!  We have resources available to help you!  Some websites that may  be helpful include: www.http://www.wall-moore.info/  Www.VeryWellFit.com _____________________________________________________________  Activity Recommendations for All Patients  I recommend that all adults get at least 30 minutes of moderate physical activity that elevates your heart rate at least 5 days out of the week.  Some examples include: Walking or jogging at a pace that allows you to carry on a conversation Cycling (stationary bike or outdoors) Water aerobics Yoga Weight lifting Dancing If physical limitations prevent you from putting stress on your joints, exercise in a pool or seated in a chair are excellent options.  Do determine your MAXIMUM heart rate for activity: 220 - YOUR AGE = MAX Heart Rate   Remember! Do not push yourself too hard.  Start slowly and build up your pace, speed, weight, time in exercise, etc.  Allow your body to rest between exercise and get good sleep. You will need more water than normal when you are exerting yourself. Do not wait until you are thirsty to drink. Drink  with a purpose of getting in at least 8, 8 ounce glasses of water a day plus more depending on how much you exercise and sweat.    If you begin to develop dizziness, chest pain, abdominal pain, jaw pain, shortness of breath, headache, vision changes, lightheadedness, or other concerning symptoms, stop the activity and allow your body to rest. If your symptoms are severe, seek emergency evaluation immediately. If your symptoms are concerning, but not severe, please let us know so that we can recommend further evaluation.

## 2023-10-05 NOTE — Assessment & Plan Note (Signed)
 Reports feeling down, likely related to chronic pain and stressors. Interested in counseling. No SI/HI. - Refer to counseling services.

## 2023-10-06 LAB — HEPATITIS C ANTIBODY: Hepatitis C Ab: NONREACTIVE

## 2023-10-06 LAB — RPR: RPR Ser Ql: NONREACTIVE

## 2023-10-06 LAB — HIV ANTIBODY (ROUTINE TESTING W REFLEX): HIV 1&2 Ab, 4th Generation: NONREACTIVE

## 2023-10-08 ENCOUNTER — Encounter: Payer: Self-pay | Admitting: Family Medicine

## 2023-10-08 LAB — URINE CYTOLOGY ANCILLARY ONLY
Chlamydia: NEGATIVE
Comment: NEGATIVE
Comment: NEGATIVE
Comment: NORMAL
Neisseria Gonorrhea: NEGATIVE
Trichomonas: NEGATIVE

## 2023-10-17 ENCOUNTER — Ambulatory Visit: Admitting: Physical Medicine and Rehabilitation

## 2023-10-24 ENCOUNTER — Ambulatory Visit: Admitting: Physical Medicine and Rehabilitation

## 2023-11-05 ENCOUNTER — Ambulatory Visit: Admitting: Physical Medicine and Rehabilitation
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
## 2024-01-18 ENCOUNTER — Ambulatory Visit (INDEPENDENT_AMBULATORY_CARE_PROVIDER_SITE_OTHER): Admitting: Psychology

## 2024-01-18 DIAGNOSIS — F419 Anxiety disorder, unspecified: Secondary | ICD-10-CM

## 2024-01-18 DIAGNOSIS — F32A Depression, unspecified: Secondary | ICD-10-CM
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
## 2024-01-30 ENCOUNTER — Ambulatory Visit (INDEPENDENT_AMBULATORY_CARE_PROVIDER_SITE_OTHER): Admitting: Psychology

## 2024-01-30 DIAGNOSIS — F419 Anxiety disorder, unspecified: Secondary | ICD-10-CM | POA: Diagnosis not present

## 2024-01-30 DIAGNOSIS — F32A Depression, unspecified: Secondary | ICD-10-CM | POA: Diagnosis not present

## 2024-01-30 NOTE — Progress Notes (Signed)
 [facility] Counselor/Therapist Progress Note  Patient ID: Darrell Price, MRN: [id number]   Date: 01/30/24  Time Spent: 8:03  am - 8:46 am : 43 Minutes  Treatment Type: Individual Therapy.  Reported Symptoms: depression and anxiety.   Mental Status Exam: Appearance:  Casual     Behavior: Appropriate  Motor: Normal  Speech/Language:  Clear and Coherent  Affect: Appropriate  Mood: depressed  Thought process: normal  Thought content:   WNL  Sensory/Perceptual disturbances:   WNL  Orientation: oriented to person, place, time/date, and situation  Attention: Good  Concentration: Good  Memory: WNL  Fund of knowledge:  Good  Insight:   Good  Judgment:  Good  Impulse Control: Good   Risk Assessment: Danger to Self:  No, but has history of. Safety was reviewed on 01/18/24 an d 01/30/24. Resources were provide via email for reference. A [employer] is pending completion.   Self-injurious Behavior: No Danger to Others: No Duty to Warn:no Physical Aggression / Violence:No  Access to Firearms a concern: No  Gang Involvement:No   In case of a mental health emergency:  63 - confidential suicide hotline. Visiting [facility] Urgent Care [facility]):        [address] [address]       [phone] 3.   911  4.   Visiting Nearest ED.    Subjective:   Darrell Price participated from home, via video and consented to treatment. Therapist participated from home office. I discussed the limitations of evaluation and management by telemedicine and the availability of in person appointments. The patient expressed understanding and agreed to proceed. [name] reviewed the events of the past week.   We reviewed numerous treatment approaches including CBT, BA, Problem Solving, and Solution focused therapy. Psych-education regarding the Sakib's diagnosis of Anxiety and depression was provided during the session. We discussed Xavyer  Janota's goals treatment goals which include improve his overall mood, manage his symptoms, stabilize mood, maintain safety, build a routine, self-care, improve distress management, bolster coping skills, managing self-talk, building support system, managing rumination, meeting with psychiatric provider. SABRA Darrell Brann provided verbal approval of the treatment plan.   Interventions: Psycho-education & Goal Setting.   Diagnosis:  Anxiety and depression  Psychiatric Treatment: No , pending IOP referral with psych.    Treatment Plan:  Client Abilities/Strengths [name] is forthcoming and motivated for change.   Support System: None.   Client Treatment Preferences Opt.   Client Statement of Needs Darrell Price would like to improve his overall mood, manage his symptoms, stabilize mood, maintain safety, build a routine, self-care, improve distress management, bolster coping skills, managing self-talk, building support system, managing rumination, meeting with psychiatric provider.   Treatment Level Weekly  Symptoms  Depression:  loss of interest, feeling down, difficulty falling and staying asleep, lethargy, feeling bad about self, trouble concentrating, psychomotor retardation, poor appetite, SI (not current)    (Status: maintained) Anxiety: Feeling anxious, difficulty managing worry, worrying too much about different things, trouble relaxing, restlessness,  irritability,  feeling afraid something awful might happen, panic (hx).   (Status: maintained)  Goals:   Darrell Price experiences symptoms of anxiety of depression.   Treatment plan signed and available on s-drive:   Yes, please see patient chart for sign off.  [name] was sent the treatment plan signature form on 01/30/24..   Target Date: 01/29/25 Frequency: Weekly  Progress: 0 Modality: individual    Therapist will provide referrals for additional  resources as appropriate.  Therapist will provide psycho-education regarding  Darrell Price's diagnosis and corresponding treatment approaches and interventions. Darrell Mullet, LCSW will support the patient's ability to achieve the goals identified. will employ CBT, BA, Problem-solving, Solution Focused, Mindfulness,  coping skills, & other evidenced-based practices will be used to promote progress towards healthy functioning to help manage decrease symptoms associated with his diagnosis.   Reduce overall level, frequency, and intensity of the feelings of depression, anxiety and panic evidenced by decreased overall sx from 6 to 7 days/week to 0 to 1 days/week per client report for at least 3 consecutive months. Verbally express understanding of the relationship between feelings of depression, anxiety and their impact on thinking patterns and behaviors. Verbalize an understanding of the role that distorted thinking plays in creating fears, excessive worry, and ruminations.    Darrell Price participated in the creation of the treatment plan)   Darrell Mullet, LCSW

## 2024-02-08 ENCOUNTER — Encounter: Admitting: Psychology

## 2024-02-08 NOTE — Progress Notes (Signed)
 This encounter was created in error - please disregard.

## 2024-02-08 NOTE — Progress Notes (Deleted)
° ° ° ° ° ° ° ° ° ° ° ° ° ° °  Darrell Herzberg, LCSW °

## 2024-02-15 ENCOUNTER — Encounter: Admitting: Psychology

## 2024-02-15 NOTE — Progress Notes (Signed)
 This encounter was created in error - please disregard.

## 2024-02-18 ENCOUNTER — Ambulatory Visit (INDEPENDENT_AMBULATORY_CARE_PROVIDER_SITE_OTHER): Admitting: Psychology

## 2024-02-18 DIAGNOSIS — F419 Anxiety disorder, unspecified: Secondary | ICD-10-CM

## 2024-02-18 DIAGNOSIS — F32A Depression, unspecified: Secondary | ICD-10-CM

## 2024-02-18 NOTE — Progress Notes (Signed)
 [facility] Counselor/Therapist Progress Note  Patient ID: Darrell Price, MRN: [id number]   Date: 02/18/24  Time Spent: 9:07 am - 10:01  am : 54 Minutes  Treatment Type: Individual Therapy.  Reported Symptoms: depression and anxiety.   Mental Status Exam: Appearance:  Casual     Behavior: Appropriate  Motor: Normal  Speech/Language:  Clear and Coherent  Affect: Appropriate  Mood: depressed  Thought process: normal  Thought content:   WNL  Sensory/Perceptual disturbances:   WNL  Orientation: oriented to person, place, time/date, and situation  Attention: Good  Concentration: Good  Memory: WNL  Fund of knowledge:  Good  Insight:   Good  Judgment:  Good  Impulse Control: Good   Risk Assessment: Danger to Self:  No, but has history of. Safety was reviewed on 01/18/24 an d 01/30/24. Resources were provide via email for reference. A [employer] is pending completion.   Self-injurious Behavior: No Danger to Others: No Duty to Warn:no Physical Aggression / Violence:No  Access to Firearms a concern: No  Gang Involvement:No   In case of a mental health emergency:  37 - confidential suicide hotline. Visiting Behavioral Health Urgent Care [facility]):        [address], [place] [address]       [phone] 3.   911  4.   Visiting Nearest ED.    Subjective:   Darrell Price participated from home, via video and consented to treatment. Therapist participated from office. He provided feedback regarding his past week. He noted feeling increasing sad and tearful and noted this dissipating around two days later. He could not recall any contributing factors to this. He noted having difficulty managing this and could not identifying any current coping skills to manage these symptoms. He noted feeling a lack of social support. He noted previously having support from friend who he previously dated. He noted often feeling disconnected when upset  and depressed. He noted often forgetting to turn off the faucet or leave the stove on. We worked on processing this. We worked on reviewing relaxation and calming techniques and therapist reviewed grounding exercises and 4-7-8- breathing. Therapist modeled these during the session. We reviewed additional coping skills including exercise such as walking. Therapist provided psycho-education regarding depression. Darrell Price was engaged and motivated during the session. He expressed commitment towards goals. Therapist praised Darrell Price's effort during the session. Therapist validated Darrell Price feelings. A follow-up was scheduled for continued treatment, which he benefits from.   Interventions: CBT & relaxation.   Diagnosis:  Anxiety and depression  Psychiatric Treatment: No , pending IOP referral with psych.    Treatment Plan:  Client Abilities/Strengths Darrell Price is forthcoming and motivated for change.   Support System: None.   Client Treatment Preferences Opt.   Client Statement of Needs Darrell Price would like to improve his overall mood, manage his symptoms, stabilize mood, maintain safety, build a routine, self-care, improve distress management, bolster coping skills, managing self-talk, building support system, managing rumination, meeting with psychiatric provider.   Treatment Level Weekly  Symptoms  Depression:  loss of interest, feeling down, difficulty falling and staying asleep, lethargy, feeling bad about self, trouble concentrating, psychomotor retardation, poor appetite, SI (not current)    (Status: maintained) Anxiety: Feeling anxious, difficulty managing worry, worrying too much about different things, trouble relaxing, restlessness,  irritability,  feeling afraid something awful might happen, panic (hx).   (Status: maintained)  Goals:   Darrell Price experiences symptoms of anxiety of depression.  Treatment plan signed and available on s-drive:   Yes, please see patient chart for sign  off.  Darrell Price was sent the treatment plan signature form on 01/30/24..   Target Date: 01/29/25 Frequency: Weekly  Progress: 0 Modality: individual    Therapist will provide referrals for additional resources as appropriate.  Therapist will provide psycho-education regarding Darrell Price diagnosis and corresponding treatment approaches and interventions. Darrell Mullet, LCSW will support the patient's ability to achieve the goals identified. will employ CBT, BA, Problem-solving, Solution Focused, Mindfulness,  coping skills, & other evidenced-based practices will be used to promote progress towards healthy functioning to help manage decrease symptoms associated with his diagnosis.   Reduce overall level, frequency, and intensity of the feelings of depression, anxiety and panic evidenced by decreased overall sx from 6 to 7 days/week to 0 to 1 days/week per client report for at least 3 consecutive months. Verbally express understanding of the relationship between feelings of depression, anxiety and their impact on thinking patterns and behaviors. Verbalize an understanding of the role that distorted thinking plays in creating fears, excessive worry, and ruminations.    [name] participated in the creation of the treatment plan)   Darrell Mullet, LCSW

## 2024-03-04 ENCOUNTER — Encounter: Admitting: Psychology

## 2024-03-04 NOTE — Progress Notes (Signed)
 This encounter was created in error - please disregard.  This encounter was created in error - please disregard.

## 2024-03-12 ENCOUNTER — Encounter: Admitting: Psychology

## 2024-03-12 NOTE — Progress Notes (Signed)
                Dayannara Pascal, LCSW This encounter was created in error - please disregard. 

## 2024-03-19 ENCOUNTER — Ambulatory Visit (INDEPENDENT_AMBULATORY_CARE_PROVIDER_SITE_OTHER): Admitting: Psychology

## 2024-03-19 DIAGNOSIS — F419 Anxiety disorder, unspecified: Secondary | ICD-10-CM | POA: Diagnosis not present

## 2024-03-19 DIAGNOSIS — F32A Depression, unspecified: Secondary | ICD-10-CM | POA: Diagnosis not present

## 2024-03-19 NOTE — Progress Notes (Signed)
 [facility] Counselor/Therapist Progress Note  Patient ID: Darrell Price, MRN: [id number]   Date: 03/19/24  Time Spent: 11:15 am - 12:01  pm : 46 Minutes  Treatment Type: Individual Therapy.  Reported Symptoms: depression and anxiety.   Mental Status Exam: Appearance:  Casual     Behavior: Appropriate  Motor: Normal  Speech/Language:  Clear and Coherent  Affect: Appropriate  Mood: depressed  Thought process: normal  Thought content:   WNL  Sensory/Perceptual disturbances:   WNL  Orientation: oriented to person, place, time/date, and situation  Attention: Good  Concentration: Good  Memory: WNL  Fund of knowledge:  Good  Insight:   Good  Judgment:  Good  Impulse Control: Good   Risk Assessment: Danger to Self:  No, but has history of. Safety was reviewed on 01/18/24 an d 01/30/24. Resources were provide via email for reference. A [employer] is pending completion.   Self-injurious Behavior: No Danger to Others: No Duty to Warn:no Physical Aggression / Violence:No  Access to Firearms a concern: No  Gang Involvement:No   In case of a mental health emergency:  61 - confidential suicide hotline. Visiting [facility] Urgent Care [facility]):        [address], [place] [address]       [phone] 3.   911  4.   Visiting Nearest ED.    Subjective:   [name] participated from home, via video and consented to treatment. Therapist participated from home office. We switched to audio due to connectivity issues. >50% of the session was audio.  He provided feedback regarding his past week. Therapist followed up on a release of information that was sent to Brittany to facilitate IOP treatment. He will work on completing this form between session. He noted his effort to stay busy to not fall into a depression. He noted some positives from this but that he continues to feel down. He noted interest in reviewing grounding  techniques. He noted his attempts to work on this between session and noted that it can initially works but that he will feel distress after this.  He noted one of his current stressors being the strain between him and his roommate. He noted a history of a romantic relationship but noted that their relationship is no longer romantic. He provided details regarding this relationship which began in their teenage years. He noted currently that they are not on speaking terms. We worked on exploring this and engaged in feeling identification. We worked on highlighting areas of control and lack of control. Therapist encouraged Pawel to work on identifying healthy and adaptive ways to manage his stressor proactively.Therapist praised Render for his effort to address these concerns with his roommate despite his roommate not reciprocating. Javanni was engaged and motivated during the session. He expressed commitment towards goals. Therapist validated Onofre's feelings and experience and provided supportive therapy. A follow-up was scheduled for continued treatment which he benefits from.   Interventions: CBT & relaxation.   Diagnosis:  Anxiety and depression  Psychiatric Treatment: No , pending IOP referral with psych.    Treatment Plan:  Client Abilities/Strengths Darrell Price is forthcoming and motivated for change.   Support System: None.   Client Treatment Preferences Opt.   Client Statement of Needs Darrell Price would like to improve his overall mood, manage his symptoms, stabilize mood, maintain safety, build a routine, self-care, improve distress management, bolster coping skills, managing self-talk, building support system, managing rumination, meeting with  psychiatric provider.   Treatment Level Weekly  Symptoms  Depression:  loss of interest, feeling down, difficulty falling and staying asleep, lethargy, feeling bad about self, trouble concentrating, psychomotor retardation, poor appetite, SI (not  current)    (Status: maintained) Anxiety: Feeling anxious, difficulty managing worry, worrying too much about different things, trouble relaxing, restlessness,  irritability,  feeling afraid something awful might happen, panic (hx).   (Status: maintained)  Goals:   Darrell Price experiences symptoms of anxiety of depression.   Treatment plan signed and available on s-drive:   Yes, please see patient chart for sign off.  Darrell Price was sent the treatment plan signature form on 01/30/24..   Target Date: 01/29/25 Frequency: Weekly  Progress: 0 Modality: individual    Therapist will provide referrals for additional resources as appropriate.  Therapist will provide psycho-education regarding Adreyan's diagnosis and corresponding treatment approaches and interventions. [name], LCSW will support the patient's ability to achieve the goals identified. will employ CBT, BA, Problem-solving, Solution Focused, Mindfulness,  coping skills, & other evidenced-based practices will be used to promote progress towards healthy functioning to help manage decrease symptoms associated with his diagnosis.   Reduce overall level, frequency, and intensity of the feelings of depression, anxiety and panic evidenced by decreased overall sx from 6 to 7 days/week to 0 to 1 days/week per client report for at least 3 consecutive months. Verbally express understanding of the relationship between feelings of depression, anxiety and their impact on thinking patterns and behaviors. Verbalize an understanding of the role that distorted thinking plays in creating fears, excessive worry, and ruminations.    [name] participated in the creation of the treatment plan)   [name], LCSW

## 2024-04-01 ENCOUNTER — Telehealth (HOSPITAL_COMMUNITY): Payer: Self-pay | Admitting: Professional

## 2024-04-03 ENCOUNTER — Ambulatory Visit: Admitting: Psychology

## 2024-04-03 DIAGNOSIS — F419 Anxiety disorder, unspecified: Secondary | ICD-10-CM | POA: Diagnosis not present

## 2024-04-03 DIAGNOSIS — F32A Depression, unspecified: Secondary | ICD-10-CM | POA: Diagnosis not present

## 2024-04-03 NOTE — Progress Notes (Signed)
 [facility] Counselor/Therapist Progress Note  Patient ID: Darrell Price, MRN: [id number]   Date: 04/03/24  Time Spent: 8:07 am - 8:58 am : 52 Minutes  Treatment Type: Individual Therapy.  Reported Symptoms: depression and anxiety.   Mental Status Exam: Appearance:  Casual     Behavior: Appropriate  Motor: Normal  Speech/Language:  Clear and Coherent  Affect: Appropriate  Mood: depressed  Thought process: normal  Thought content:   WNL  Sensory/Perceptual disturbances:   WNL  Orientation: oriented to person, place, time/date, and situation  Attention: Good  Concentration: Good  Memory: WNL  Fund of knowledge:  Good  Insight:   Good  Judgment:  Good  Impulse Control: Good   Risk Assessment: Danger to Self:  No, but has history of. Safety was reviewed on 01/18/24 an d 01/30/24. Resources were provide via email for reference. A [employer] is pending completion.   Self-injurious Behavior: No Danger to Others: No Duty to Warn:no Physical Aggression / Violence:No  Access to Firearms a concern: No  Gang Involvement:No   In case of a mental health emergency:  31 - confidential suicide hotline. [facility]        [address], [place] [address]       [phone] 3.   911  4.   Visiting Nearest ED.    Subjective:   [name] participated from home, via video and consented to treatment. Therapist participated from home office. We switched to audio due to connectivity issues. >50% of the session was audio.  He provided feedback regarding his past week. He noted his efforts to stay busy and noted this aiding in his mood, overall. He noted working on cars and noted enjoying it but noted this being a punishment as a child. Therapist informed [name] that the IOP program does not accept his insurance but that he was alternatively referred to a PHP program.  He noted familial stressors, being  pulled in different directions, being over relied on by family for emotional and financial support. He noted this largely being unreciprocated with the exception of his sister Darrell Price prior to some strain which Zachary could not identify a cause for. He noted continued strain, mostly for him, due to him pulling away from others. We will continue to process this going forward. We worked on reviewing his self-care routine. [name] noted his eating being off due to work schedules and limitations. He noted sleeping better due to his physical output at work. He noted getting more physical exercise, as well. He noted losing ~5.5# in the past 3 months. We worked on exploring barriers to eating more consistently and ways to problem-solve this. 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 We discussed ways to address his dietary intake and ways to eat more regularly in the morning. [name] was receptive to this. He noted interest in discontinuing his marijuana use and noted previously not smoking for ~7 years. He noted his goal to discontinue his smoking but noted that stress can be a trigger to smoke. We worked on reviewing his past coping during the 7 years of not smoking marijuana and [name] noted that life was much busier and he had responsibilities related to his children. Therapist encouraged Darrell Price to work on identifying possible alternative tools and strategies to manage his stress. WE will work on processing this during our  follow-up. Therapist validated [name]'s feelings and experience and provided supportive therapy. A follow-up was scheduled for continued treatment, which he benefits from.   Interventions: CBT & relaxation.   Diagnosis:  Anxiety and depression  Psychiatric Treatment: No , pending IOP referral with psych.    Treatment Plan:  Client Abilities/Strengths Albert is forthcoming and motivated for change.   Support  System: None.   Client Treatment Preferences Opt.   Client Statement of Needs Darrell Price would like to improve his overall mood, manage his symptoms, stabilize mood, maintain safety, build a routine, self-care, improve distress management, bolster coping skills, managing self-talk, building support system, managing rumination, meeting with psychiatric provider.   Treatment Level Weekly  Symptoms  Depression:  loss of interest, feeling down, difficulty falling and staying asleep, lethargy, feeling bad about self, trouble concentrating, psychomotor retardation, poor appetite, SI (not current)    (Status: maintained) Anxiety: Feeling anxious, difficulty managing worry, worrying too much about different things, trouble relaxing, restlessness,  irritability,  feeling afraid something awful might happen, panic (hx).   (Status: maintained)  Goals:   Talbot experiences symptoms of anxiety of depression.   Treatment plan signed and available on s-drive:   Yes, please see patient chart for sign off.  [name] was sent the treatment plan signature form on 01/30/24..   Target Date: 01/29/25 Frequency: Weekly  Progress: 0 Modality: individual    Therapist will provide referrals for additional resources as appropriate.  Therapist will provide psycho-education regarding Darrell Price's diagnosis and corresponding treatment approaches and interventions. [name], LCSW will support the patient's ability to achieve the goals identified. will employ CBT, BA, Problem-solving, Solution Focused, Mindfulness,  coping skills, & other evidenced-based practices will be used to promote progress towards healthy functioning to help manage decrease symptoms associated with his diagnosis.   Reduce overall level, frequency, and intensity of the feelings of depression, anxiety and panic evidenced by decreased overall sx from 6 to 7 days/week to 0 to 1 days/week per client report for at least 3 consecutive  months. Verbally express understanding of the relationship between feelings of depression, anxiety and their impact on thinking patterns and behaviors. Verbalize an understanding of the role that distorted thinking plays in creating fears, excessive worry, and ruminations.    [name] participated in the creation of the treatment plan)   [name], LCSW

## 2024-04-08 NOTE — Telephone Encounter (Signed)
 See call log

## 2024-04-11 ENCOUNTER — Encounter: Payer: Self-pay | Admitting: Family Medicine

## 2024-04-11 ENCOUNTER — Ambulatory Visit (INDEPENDENT_AMBULATORY_CARE_PROVIDER_SITE_OTHER): Admitting: Family Medicine

## 2024-04-11 VITALS — BP 114/72 | HR 100 | Ht 66.0 in | Wt 175.0 lb

## 2024-04-11 DIAGNOSIS — Z716 Tobacco abuse counseling: Secondary | ICD-10-CM | POA: Diagnosis not present

## 2024-04-11 DIAGNOSIS — F32A Depression, unspecified: Secondary | ICD-10-CM

## 2024-04-11 DIAGNOSIS — F1210 Cannabis abuse, uncomplicated: Secondary | ICD-10-CM

## 2024-04-11 DIAGNOSIS — F17210 Nicotine dependence, cigarettes, uncomplicated: Secondary | ICD-10-CM | POA: Diagnosis not present

## 2024-04-11 DIAGNOSIS — F419 Anxiety disorder, unspecified: Secondary | ICD-10-CM | POA: Diagnosis not present

## 2024-04-11 MED ORDER — HYDROXYZINE HCL 10 MG PO TABS: 10.0000 mg | ORAL_TABLET | Freq: Three times a day (TID) | ORAL | 0 refills | Status: AC | PRN

## 2024-04-11 MED ORDER — BUPROPION HCL ER (SR) 150 MG PO TB12: 150.0000 mg | ORAL_TABLET | Freq: Two times a day (BID) | ORAL | 3 refills | Status: AC

## 2024-04-11 NOTE — Progress Notes (Signed)
 Acute Office Visit  Subjective:     Patient ID: Darrell Price, male    DOB: [birth year], 43 y.o.   MRN: [id number]  Chief Complaint  Patient presents with   Medical Management of Chronic Issues    HPI Patient is in today for depression, smoking cessation.    Discussed the use of AI scribe software for clinical note transcription with the patient, who gave verbal consent to proceed.  History of Present Illness Darrell Price is a 43 year old male who presents with mood disturbances and depression.  He experiences significant mood disturbances that affect his ability to work and maintain relationships. A few months ago, he had thoughts of self-harm but has not had such thoughts since then, attributing a change in mindset to hearing his son, which deterred him from acting on those thoughts. He is currently in counseling, but has never been on medications for mood.   He smokes ten Black & Mild cigars a day and has been using marijuana, which he believes contributes to his mood issues. He reports increased smoking, which he feels triggers his depression and feelings of being down. 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  He denies any family history of anxiety or depression treatment. He has a support system, including someone who also wants to quit smoking, which he finds helpful for accountability. He has a therapist and attends therapy sessions.  He uses alcohol and smokes both cigarettes and marijuana. He reports that smoking and marijuana use trigger his depression and feelings of being down.  He feels more depressed than anxious over the last two weeks. No current thoughts of self-harm but acknowledges past thoughts a month ago.          04/11/2024   11:12 AM 10/05/2023   11:54 AM  PHQ9 SCORE ONLY  PHQ-9 Total Score 20 9      Data saved with a previous flowsheet row definition      04/11/2024   11:12 AM 10/05/2023   11:55  AM  GAD 7 : Generalized Anxiety Score  Nervous, Anxious, on Edge 2 2  Control/stop worrying 3 1  Worry too much - different things 2 2  Trouble relaxing 2 1  Restless 3 0  Easily annoyed or irritable 2 2  Afraid - awful might happen 3 2  Total GAD 7 Score 17 10  Anxiety Difficulty Very difficult Somewhat difficult        ROS All review of systems negative except what is listed in the HPI      Objective:    BP 114/72   Pulse 100   Ht 5' 6 (1.676 m)   Wt 175 lb (79.4 kg)   SpO2 98%   BMI 28.25 kg/m    Physical Exam Vitals reviewed.  Constitutional:      Appearance: Normal appearance.  Cardiovascular:     Rate and Rhythm: Normal rate and regular rhythm.     Heart sounds: Normal heart sounds.  Pulmonary:     Effort: Pulmonary effort is normal.     Breath sounds: Normal breath sounds.  Skin:    General: Skin is warm and dry.  Neurological:     Mental Status: He is alert and oriented to person, place, and time.  Psychiatric:        Mood and Affect: Mood normal.        Behavior: Behavior normal.  Thought Content: Thought content normal.        Judgment: Judgment normal.        No results found for any visits on 04/11/24.      Assessment & Plan:   Problem List Items Addressed This Visit       Active Problems   Anxiety and depression - Primary   Relevant Medications   buPROPion (WELLBUTRIN SR) 150 MG 12 hr tablet   hydrOXYzine (ATARAX) 10 MG tablet   Other Relevant Orders   Ambulatory referral to Psychiatry   Other Visit Diagnoses       Encounter for smoking cessation counseling         Cigarette nicotine dependence without complication       Relevant Medications   buPROPion (WELLBUTRIN SR) 150 MG 12 hr tablet     Marijuana abuse            Assessment & Plan  Increased depressive symptoms with a history of suicide attempt. No prior treatment. Marijuana use may exacerbate mood issues. Wellbutrin considered for depression and  smoking cessation. - Prescribe Wellbutrin (bupropion)  - Refer to psychiatry for further evaluation and management - Continue with current therapy sessions. - Provide educational materials on MyChart regarding new medication and marijuana cessation. - Advise to seek immediate help if experiencing thoughts of self-harm.           Meds ordered this encounter  Medications   buPROPion (WELLBUTRIN SR) 150 MG 12 hr tablet    Sig: Take 1 tablet (150 mg total) by mouth 2 (two) times daily. Take one tablet (150 mg) by mouth daily for the first 3-5 days, then increase to one tablet twice daily and continue for 7-12 weeks. Take evening dose no later than 6pm. Stop smoking after the first 5-7 days of treatment.    Dispense:  60 tablet    Refill:  3    Supervising Provider:   DOMENICA BLACKBIRD A [[id number]]   hydrOXYzine (ATARAX) 10 MG tablet    Sig: Take 1-2 tablets (10-20 mg total) by mouth 3 (three) times daily as needed.    Dispense:  30 tablet    Refill:  0    Supervising Provider:   DOMENICA BLACKBIRD A [[id number]]    Return in about 6 weeks (around 05/23/2024) for mood follow-up.  [name], NP

## 2024-04-11 NOTE — Patient Instructions (Signed)
[facility] - Urgent care services - Outpatient services: Individual Therapy Partial Hospitalization Program (PHP) Substance Abuse Intensive Outpatient Program [facility]) Specialized Intensive Adult Group Therapy Medication Management Peer Living Room  Phone: [phone]  Address: [address], [place] [address]  Hours: Open 24/7, No appointment required.

## 2024-04-14 ENCOUNTER — Encounter: Admitting: Psychology

## 2024-04-14 DIAGNOSIS — F32A Depression, unspecified: Secondary | ICD-10-CM

## 2024-04-14 NOTE — Progress Notes (Signed)
 This encounter was created in error - please disregard.

## 2024-05-12 ENCOUNTER — Encounter: Payer: Self-pay | Admitting: Radiology

## 2024-05-12 ENCOUNTER — Ambulatory Visit: Admitting: Family Medicine

## 2024-05-12 NOTE — Progress Notes (Incomplete)
 Established Patient Office Visit  Subjective:  Patient ID: Darrell Price, male    DOB: 10-May-1981  Age: 43 y.o. MRN: [id number]  CC: No chief complaint on file.     HPI Darrell Price is here for 1 month follow-up on Mood.      Mood follow-up: - Diagnosis: Anxiety and Depression - Treatment: Hydroxyzine 10-20 mg PRN up to three times daily and Wellbutrin 150 mg BID for smoking cessation. - Medication side effects:   - Is followed by a behavioral counselor - SI/HI:  - Update: .  Tobacco Abuse; Marijuana Abuse: - Management: Wellbutrin 150 mg BID.  - Smokes ten Black & Mild cigars a day and reported an increase in Marijuana use last month.   No past medical history on file.  Past Surgical History:  Procedure Laterality Date   WISDOM TOOTH EXTRACTION      Family History  Problem Relation Age of Onset   Hypertension Mother    Cancer Father        lung   Hypertension Sister    Throat cancer Paternal Grandmother     Social History   Socioeconomic History   Marital status: Single    Spouse name: Not on file   Number of children: Not on file   Years of education: Not on file   Highest education level: 9th grade  Occupational History   Not on file  Tobacco Use   Smoking status: Every Day    Types: Cigars   Smokeless tobacco: Never  Vaping Use   Vaping status: Never Used  Substance and Sexual Activity   Alcohol use: Yes    Comment: occasional   Drug use: Yes    Types: Marijuana   Sexual activity: Not Currently  Other Topics Concern   Not on file  Social History Narrative   Not on file   Social Drivers of Health   Financial Resource Strain: High Risk (04/10/2024)   Overall Financial Resource Strain (CARDIA)    Difficulty of Paying Living Expenses: Hard  Food Insecurity: Food Insecurity Present (04/10/2024)   Hunger Vital Sign    Worried About Programme Researcher, Broadcasting/film/video in the Last Year: Often true    Ran Out of Food in the Last Year: Often true   Transportation Needs: Unmet Transportation Needs (04/10/2024)   PRAPARE - Administrator, Civil Service (Medical): Yes    Lack of Transportation (Non-Medical): Yes  Physical Activity: Inactive (04/10/2024)   Exercise Vital Sign    Days of Exercise per Week: 0 days    Minutes of Exercise per Session: Not on file  Stress: Stress Concern Present (04/10/2024)   [employer] of Occupational Health - Occupational Stress Questionnaire    Feeling of Stress: Very much  Social Connections: Socially Isolated (04/10/2024)   Social Connection and Isolation Panel    Frequency of Communication with Friends and Family: Once a week    Frequency of Social Gatherings with Friends and Family: Never    Attends Religious Services: Never    Database Administrator or Organizations: No    Attends Engineer, Structural: Not on file    Marital Status: Divorced  Intimate Partner Violence: Not At Risk (10/26/2022)   Humiliation, Afraid, Rape, and Kick questionnaire    Fear of Current or Ex-Partner: No    Emotionally Abused: No    Physically Abused: No    Sexually Abused: No    ROS All ROS negative except what is listed  in the HPI.   Objective:   Today's Vitals: There were no vitals taken for this visit.  Physical Exam  Assessment & Plan:   Problem List Items Addressed This Visit   None     Follow-up: No follow-ups on file.   Waddell FURY Almarie, DNP, FNP-C  I,Emily Lagle,acting as a neurosurgeon for Waddell KATHEE Almarie, NP.,have documented all relevant documentation on the behalf of Waddell KATHEE Almarie, NP,as directed by  Waddell KATHEE Almarie, NP while in the presence of Waddell KATHEE Almarie, NP.   I, Waddell KATHEE Almarie, NP, have reviewed all documentation for this visit. The documentation on 05/12/2024 for the exam, diagnosis, procedures, and orders are all accurate and complete.

## 2024-06-30 ENCOUNTER — Ambulatory Visit (HOSPITAL_COMMUNITY): Admitting: Family
